# Patient Record
Sex: Female | Born: 1950 | Race: Black or African American | Hispanic: No | Smoking: Never smoker
Health system: Southern US, Community
[De-identification: ages and names within clinical notes are randomized; demographics above are authoritative.]

## PROBLEM LIST (undated history)

## (undated) DIAGNOSIS — R011 Cardiac murmur, unspecified: Secondary | ICD-10-CM

## (undated) DIAGNOSIS — E7800 Pure hypercholesterolemia, unspecified: Secondary | ICD-10-CM

---

## 1998-02-07 ENCOUNTER — Encounter: Payer: Self-pay | Admitting: Family Medicine

## 1998-02-07 ENCOUNTER — Ambulatory Visit (HOSPITAL_COMMUNITY): Admission: RE | Admit: 1998-02-07 | Discharge: 1998-02-07 | Payer: Self-pay | Admitting: Family Medicine

## 1998-03-23 ENCOUNTER — Ambulatory Visit (HOSPITAL_COMMUNITY): Admission: RE | Admit: 1998-03-23 | Discharge: 1998-03-23 | Payer: Self-pay | Admitting: Family Medicine

## 1998-03-23 ENCOUNTER — Encounter: Payer: Self-pay | Admitting: Family Medicine

## 2001-03-17 ENCOUNTER — Other Ambulatory Visit: Admission: RE | Admit: 2001-03-17 | Discharge: 2001-03-17 | Payer: Self-pay | Admitting: Family Medicine

## 2004-12-21 ENCOUNTER — Other Ambulatory Visit: Admission: RE | Admit: 2004-12-21 | Discharge: 2004-12-21 | Payer: Self-pay | Admitting: Family Medicine

## 2008-03-04 ENCOUNTER — Ambulatory Visit (HOSPITAL_COMMUNITY): Admission: RE | Admit: 2008-03-04 | Discharge: 2008-03-04 | Payer: Self-pay | Admitting: Family Medicine

## 2008-07-20 ENCOUNTER — Other Ambulatory Visit: Admission: RE | Admit: 2008-07-20 | Discharge: 2008-07-20 | Payer: Self-pay | Admitting: Family Medicine

## 2009-04-28 ENCOUNTER — Ambulatory Visit (HOSPITAL_COMMUNITY): Admission: RE | Admit: 2009-04-28 | Discharge: 2009-04-28 | Payer: Self-pay | Admitting: Family Medicine

## 2010-03-20 ENCOUNTER — Other Ambulatory Visit (HOSPITAL_BASED_OUTPATIENT_CLINIC_OR_DEPARTMENT_OTHER): Payer: Self-pay | Admitting: Family Medicine

## 2010-03-20 ENCOUNTER — Other Ambulatory Visit (HOSPITAL_COMMUNITY): Payer: Self-pay | Admitting: Family Medicine

## 2010-03-20 DIAGNOSIS — Z1231 Encounter for screening mammogram for malignant neoplasm of breast: Secondary | ICD-10-CM

## 2010-05-02 ENCOUNTER — Ambulatory Visit (HOSPITAL_COMMUNITY)
Admission: RE | Admit: 2010-05-02 | Discharge: 2010-05-02 | Disposition: A | Discharge status: Home / Self Care | Payer: Self-pay | Source: Ambulatory Visit | Attending: Family Medicine | Admitting: Family Medicine

## 2010-05-02 DIAGNOSIS — Z1231 Encounter for screening mammogram for malignant neoplasm of breast: Secondary | ICD-10-CM

## 2010-12-05 ENCOUNTER — Encounter: Payer: Self-pay | Admitting: Emergency Medicine

## 2010-12-05 ENCOUNTER — Emergency Department (HOSPITAL_COMMUNITY)
Admission: EM | Admit: 2010-12-05 | Discharge: 2010-12-05 | Disposition: A | Discharge status: Home / Self Care | Payer: No Typology Code available for payment source | Attending: Emergency Medicine | Admitting: Emergency Medicine

## 2010-12-05 DIAGNOSIS — M542 Cervicalgia: Secondary | ICD-10-CM | POA: Insufficient documentation

## 2010-12-05 DIAGNOSIS — M545 Low back pain, unspecified: Secondary | ICD-10-CM | POA: Insufficient documentation

## 2010-12-05 DIAGNOSIS — M25519 Pain in unspecified shoulder: Secondary | ICD-10-CM | POA: Insufficient documentation

## 2010-12-05 HISTORY — DX: Cardiac murmur, unspecified: R01.1

## 2010-12-05 MED ORDER — IBUPROFEN 600 MG PO TABS: 600.0000 mg | ORAL_TABLET | Freq: Four times a day (QID) | ORAL | Status: AC | PRN

## 2010-12-05 MED ORDER — CYCLOBENZAPRINE HCL 10 MG PO TABS: 5.0000 mg | ORAL_TABLET | Freq: Three times a day (TID) | ORAL | Status: AC | PRN

## 2010-12-05 MED ORDER — IBUPROFEN 800 MG PO TABS
800.0000 mg | ORAL_TABLET | Freq: Once | ORAL | Status: AC
  Administered 2010-12-05: 800 mg via ORAL
  Filled 2010-12-05: qty 1
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
## 2010-12-06 NOTE — ED Provider Notes (Signed)
Medical screening examination/treatment/procedure(s) were performed by non-physician practitioner and as supervising physician I was immediately available for consultation/collaboration. Devoria Albe, MD, Armando Gang   Ward Givens, MD 12/06/10 [phone]

## 2012-11-11 ENCOUNTER — Other Ambulatory Visit (HOSPITAL_COMMUNITY): Payer: Self-pay | Admitting: Family Medicine

## 2012-11-11 DIAGNOSIS — Z1231 Encounter for screening mammogram for malignant neoplasm of breast: Secondary | ICD-10-CM

## 2012-12-08 ENCOUNTER — Ambulatory Visit (HOSPITAL_COMMUNITY)
Admission: RE | Admit: 2012-12-08 | Discharge: 2012-12-08 | Disposition: A | Discharge status: Home / Self Care | Payer: No Typology Code available for payment source | Source: Ambulatory Visit | Attending: Family Medicine | Admitting: Family Medicine

## 2012-12-08 DIAGNOSIS — Z1231 Encounter for screening mammogram for malignant neoplasm of breast: Secondary | ICD-10-CM | POA: Insufficient documentation

## 2014-03-29 ENCOUNTER — Other Ambulatory Visit (HOSPITAL_COMMUNITY): Payer: Self-pay | Admitting: Family Medicine

## 2014-03-29 DIAGNOSIS — Z1231 Encounter for screening mammogram for malignant neoplasm of breast: Secondary | ICD-10-CM

## 2014-04-04 ENCOUNTER — Ambulatory Visit (HOSPITAL_COMMUNITY): Payer: No Typology Code available for payment source

## 2014-04-21 ENCOUNTER — Ambulatory Visit (HOSPITAL_COMMUNITY)
Admission: RE | Admit: 2014-04-21 | Discharge: 2014-04-21 | Disposition: A | Discharge status: Home / Self Care | Payer: 59 | Source: Ambulatory Visit | Attending: Family Medicine | Admitting: Family Medicine

## 2014-04-21 DIAGNOSIS — Z1231 Encounter for screening mammogram for malignant neoplasm of breast: Secondary | ICD-10-CM | POA: Diagnosis present

## 2015-04-06 ENCOUNTER — Other Ambulatory Visit (HOSPITAL_COMMUNITY)
Admission: RE | Admit: 2015-04-06 | Discharge: 2015-04-06 | Disposition: A | Discharge status: Home / Self Care | Payer: BLUE CROSS/BLUE SHIELD | Source: Ambulatory Visit | Attending: Family Medicine | Admitting: Family Medicine

## 2015-04-06 DIAGNOSIS — Z113 Encounter for screening for infections with a predominantly sexual mode of transmission: Secondary | ICD-10-CM | POA: Diagnosis present

## 2015-04-06 DIAGNOSIS — Z1151 Encounter for screening for human papillomavirus (HPV): Secondary | ICD-10-CM | POA: Diagnosis not present

## 2015-04-06 DIAGNOSIS — N760 Acute vaginitis: Secondary | ICD-10-CM | POA: Diagnosis present

## 2015-04-06 DIAGNOSIS — Z01419 Encounter for gynecological examination (general) (routine) without abnormal findings: Secondary | ICD-10-CM | POA: Diagnosis present

## 2015-07-05 ENCOUNTER — Other Ambulatory Visit: Payer: Self-pay | Admitting: Family Medicine

## 2015-07-05 DIAGNOSIS — Z1231 Encounter for screening mammogram for malignant neoplasm of breast: Secondary | ICD-10-CM

## 2015-08-07 ENCOUNTER — Ambulatory Visit: Payer: No Typology Code available for payment source

## 2015-08-15 ENCOUNTER — Ambulatory Visit
Admission: RE | Admit: 2015-08-15 | Discharge: 2015-08-15 | Disposition: A | Discharge status: Home / Self Care | Payer: BLUE CROSS/BLUE SHIELD | Source: Ambulatory Visit | Attending: Family Medicine | Admitting: Family Medicine

## 2015-08-15 DIAGNOSIS — Z1231 Encounter for screening mammogram for malignant neoplasm of breast: Secondary | ICD-10-CM

## 2017-09-10 DIAGNOSIS — Z0100 Encounter for examination of eyes and vision without abnormal findings: Secondary | ICD-10-CM | POA: Diagnosis not present

## 2017-09-15 ENCOUNTER — Other Ambulatory Visit: Payer: Self-pay | Admitting: Family Medicine

## 2017-09-15 DIAGNOSIS — Z1231 Encounter for screening mammogram for malignant neoplasm of breast: Secondary | ICD-10-CM

## 2017-09-23 ENCOUNTER — Other Ambulatory Visit (HOSPITAL_COMMUNITY)
Admission: RE | Admit: 2017-09-23 | Discharge: 2017-09-23 | Disposition: A | Discharge status: Home / Self Care | Payer: Medicare Other | Source: Ambulatory Visit | Attending: Family Medicine | Admitting: Family Medicine

## 2017-09-23 ENCOUNTER — Other Ambulatory Visit: Payer: Self-pay | Admitting: Family Medicine

## 2017-09-23 DIAGNOSIS — R7309 Other abnormal glucose: Secondary | ICD-10-CM | POA: Diagnosis not present

## 2017-09-23 DIAGNOSIS — Z01411 Encounter for gynecological examination (general) (routine) with abnormal findings: Secondary | ICD-10-CM | POA: Diagnosis present

## 2017-09-23 DIAGNOSIS — I10 Essential (primary) hypertension: Secondary | ICD-10-CM | POA: Diagnosis not present

## 2017-09-23 DIAGNOSIS — E782 Mixed hyperlipidemia: Secondary | ICD-10-CM | POA: Diagnosis not present

## 2017-09-25 LAB — CYTOLOGY - PAP
Diagnosis: NEGATIVE
HPV: NOT DETECTED

## 2017-10-13 ENCOUNTER — Ambulatory Visit
Admission: RE | Admit: 2017-10-13 | Discharge: 2017-10-13 | Disposition: A | Discharge status: Home / Self Care | Payer: Medicare Other | Source: Ambulatory Visit | Attending: Family Medicine | Admitting: Family Medicine

## 2017-10-13 DIAGNOSIS — Z1231 Encounter for screening mammogram for malignant neoplasm of breast: Secondary | ICD-10-CM | POA: Diagnosis not present

## 2018-01-18 DIAGNOSIS — J209 Acute bronchitis, unspecified: Secondary | ICD-10-CM | POA: Diagnosis not present

## 2018-02-09 DIAGNOSIS — L218 Other seborrheic dermatitis: Secondary | ICD-10-CM | POA: Diagnosis not present

## 2018-02-09 DIAGNOSIS — L258 Unspecified contact dermatitis due to other agents: Secondary | ICD-10-CM | POA: Diagnosis not present

## 2018-04-02 ENCOUNTER — Other Ambulatory Visit: Payer: Self-pay | Admitting: Family Medicine

## 2018-04-02 ENCOUNTER — Other Ambulatory Visit (HOSPITAL_COMMUNITY)
Admission: RE | Admit: 2018-04-02 | Discharge: 2018-04-02 | Disposition: A | Discharge status: Home / Self Care | Payer: Medicare Other | Source: Ambulatory Visit | Attending: Family Medicine | Admitting: Family Medicine

## 2018-04-02 DIAGNOSIS — E559 Vitamin D deficiency, unspecified: Secondary | ICD-10-CM | POA: Diagnosis not present

## 2018-04-02 DIAGNOSIS — E785 Hyperlipidemia, unspecified: Secondary | ICD-10-CM | POA: Diagnosis not present

## 2018-04-02 DIAGNOSIS — R7303 Prediabetes: Secondary | ICD-10-CM | POA: Diagnosis not present

## 2018-04-02 DIAGNOSIS — Z0000 Encounter for general adult medical examination without abnormal findings: Secondary | ICD-10-CM | POA: Diagnosis not present

## 2018-04-03 ENCOUNTER — Other Ambulatory Visit: Payer: Self-pay | Admitting: Family Medicine

## 2018-04-03 DIAGNOSIS — E2839 Other primary ovarian failure: Secondary | ICD-10-CM

## 2018-04-06 LAB — CYTOLOGY - PAP: Diagnosis: NEGATIVE

## 2018-06-18 ENCOUNTER — Other Ambulatory Visit: Payer: Self-pay

## 2018-06-18 ENCOUNTER — Ambulatory Visit
Admission: RE | Admit: 2018-06-18 | Discharge: 2018-06-18 | Disposition: A | Discharge status: Home / Self Care | Payer: Medicare Other | Source: Ambulatory Visit | Attending: Family Medicine | Admitting: Family Medicine

## 2018-06-18 DIAGNOSIS — Z780 Asymptomatic menopausal state: Secondary | ICD-10-CM | POA: Diagnosis not present

## 2018-06-18 DIAGNOSIS — E2839 Other primary ovarian failure: Secondary | ICD-10-CM

## 2018-06-18 DIAGNOSIS — M8589 Other specified disorders of bone density and structure, multiple sites: Secondary | ICD-10-CM | POA: Diagnosis not present

## 2018-06-29 DIAGNOSIS — R7303 Prediabetes: Secondary | ICD-10-CM | POA: Diagnosis not present

## 2018-06-29 DIAGNOSIS — E785 Hyperlipidemia, unspecified: Secondary | ICD-10-CM | POA: Diagnosis not present

## 2019-02-25 ENCOUNTER — Ambulatory Visit: Payer: Medicare Other

## 2019-03-01 ENCOUNTER — Ambulatory Visit: Payer: Medicare Other | Attending: Family

## 2019-03-01 DIAGNOSIS — Z23 Encounter for immunization: Secondary | ICD-10-CM | POA: Insufficient documentation

## 2019-03-01 NOTE — Progress Notes (Signed)
   Covid-19 Vaccination Clinic  Name:  Carol Blake    MRN: [id number] DOB: [birth year]  03/01/2019  [name] was observed post Covid-19 immunization for 15 minutes without incidence. She was provided with Vaccine Information Sheet and instruction to access the V-Safe system.   [name] was instructed to call 911 with any severe reactions post vaccine: Marland Kitchen Difficulty breathing  . Swelling of your face and throat  . A fast heartbeat  . A bad rash all over your body  . Dizziness and weakness    Immunizations Administered    Name Date Dose VIS Date Route   Moderna COVID-19 Vaccine 03/01/2019  9:25 AM 0.5 mL 12/08/2018 Intramuscular   Manufacturer: Moderna   Lot: [id number]   NDC: 51582-658-71

## 2019-03-30 ENCOUNTER — Ambulatory Visit: Payer: Medicare Other | Attending: Family

## 2019-03-30 DIAGNOSIS — Z23 Encounter for immunization: Secondary | ICD-10-CM

## 2019-03-30 NOTE — Progress Notes (Signed)
   Covid-19 Vaccination Clinic  Name:  KHARI LETT    MRN: [id number] DOB: August 20, 1950  03/30/2019  Ms. Cinquemani was observed post Covid-19 immunization for 15 minutes without incident. She was provided with Vaccine Information Sheet and instruction to access the V-Safe system.   Ms. Briney was instructed to call 911 with any severe reactions post vaccine: Marland Kitchen Difficulty breathing  . Swelling of face and throat  . A fast heartbeat  . A bad rash all over body  . Dizziness and weakness   Immunizations Administered    Name Date Dose VIS Date Route   Moderna COVID-19 Vaccine 03/30/2019  9:22 AM 0.5 mL 12/08/2018 Intramuscular   Manufacturer: Moderna   Lot: [id number]   NDC: 09628-366-29

## 2019-05-11 IMAGING — MG DIGITAL SCREENING BILATERAL MAMMOGRAM WITH TOMO AND CAD
8 series · 8 of 24 positions shown · non-contrast
Comparison: Previous exam(s).

CLINICAL DATA: Screening.

EXAM:
DIGITAL SCREENING BILATERAL MAMMOGRAM WITH TOMO AND CAD

[R MLO synth-2D]
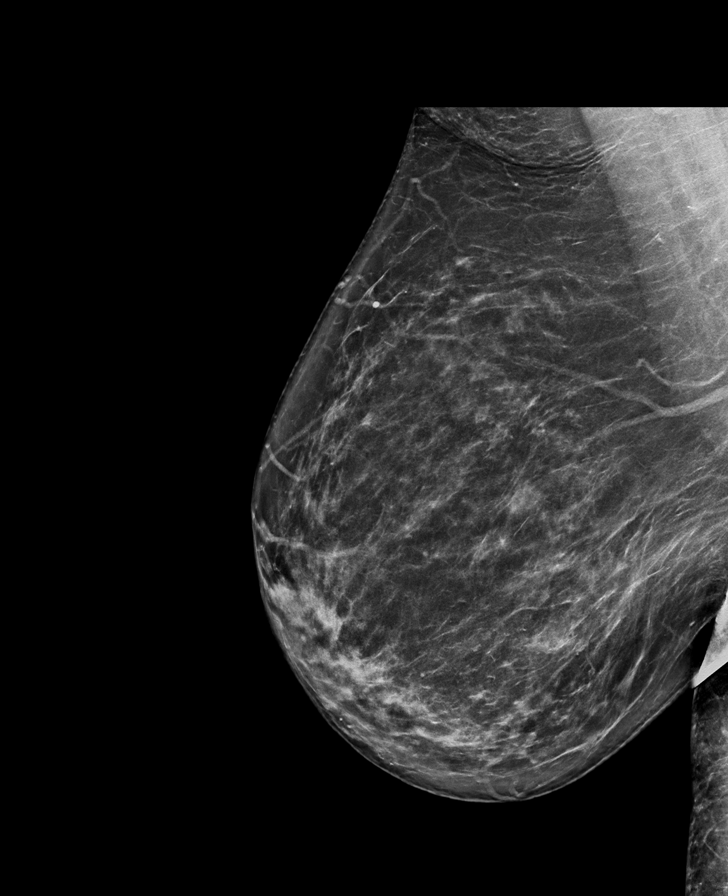

[L CC synth-2D]
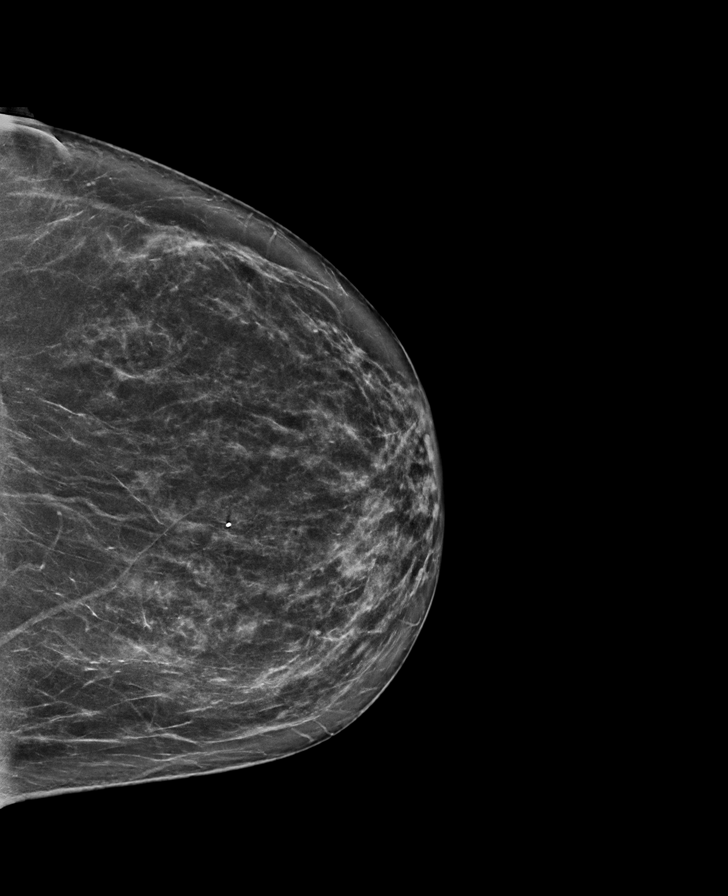

[L MLO synth-2D]
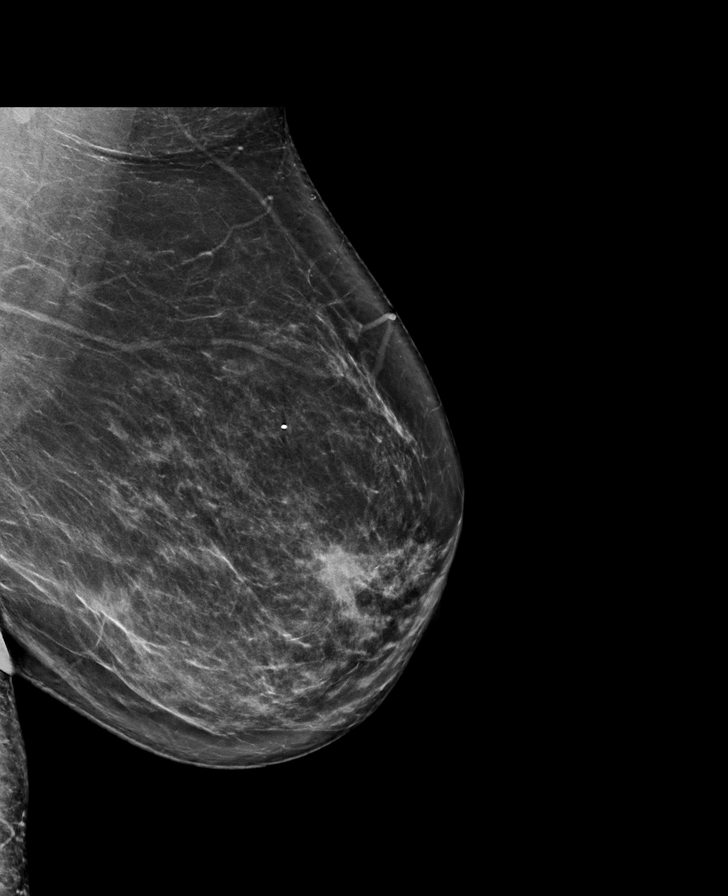

[R CC synth-2D]
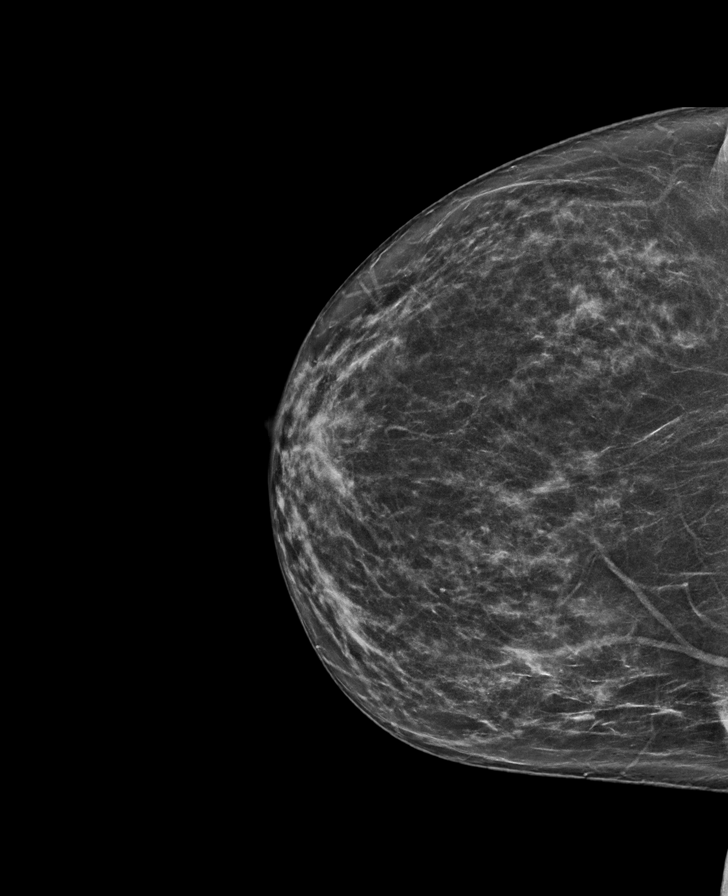

[R CC tomo · tomo slice 37/72.0]
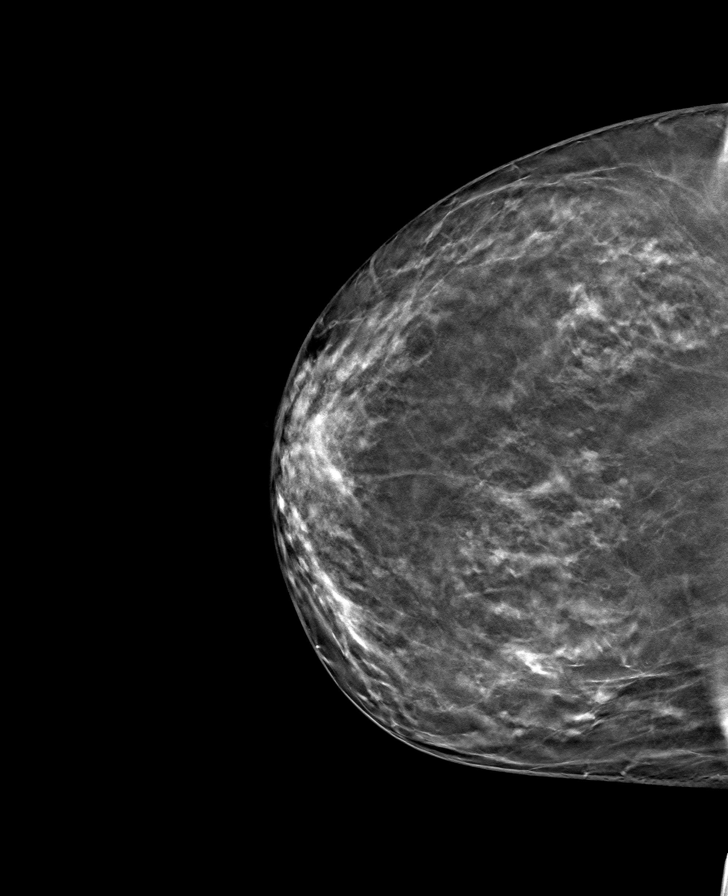

[L CC tomo · tomo slice 40/79.0]
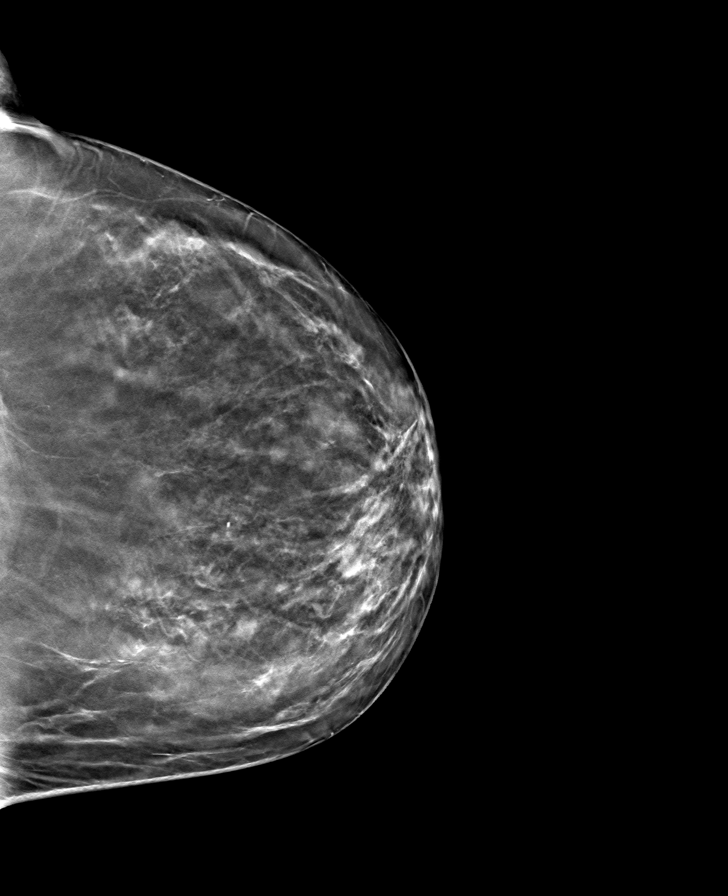

[L MLO tomo · tomo slice 43/86.0]
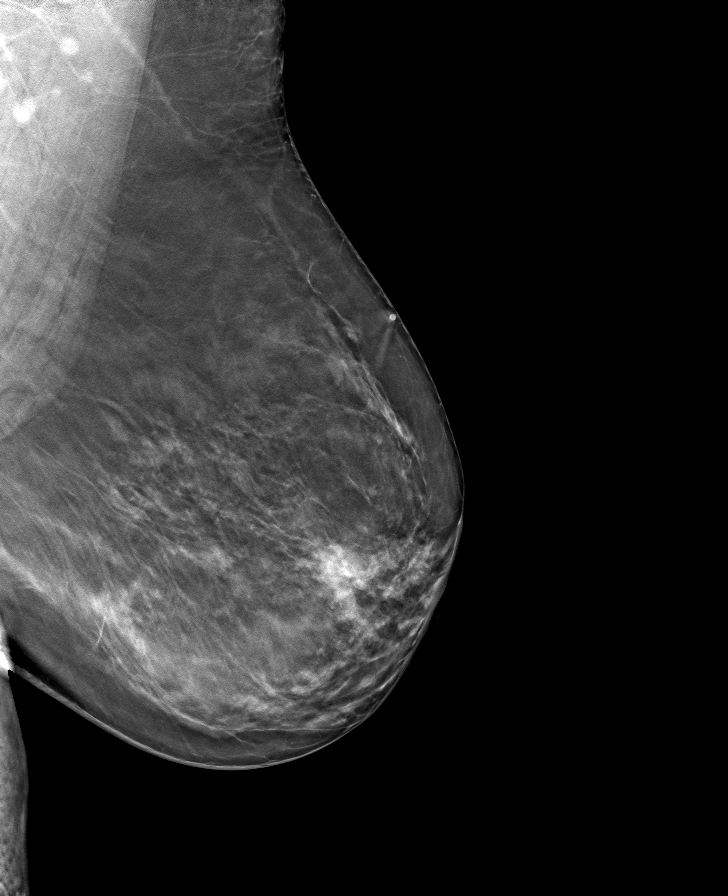

[R MLO tomo · tomo slice 44/87.0]
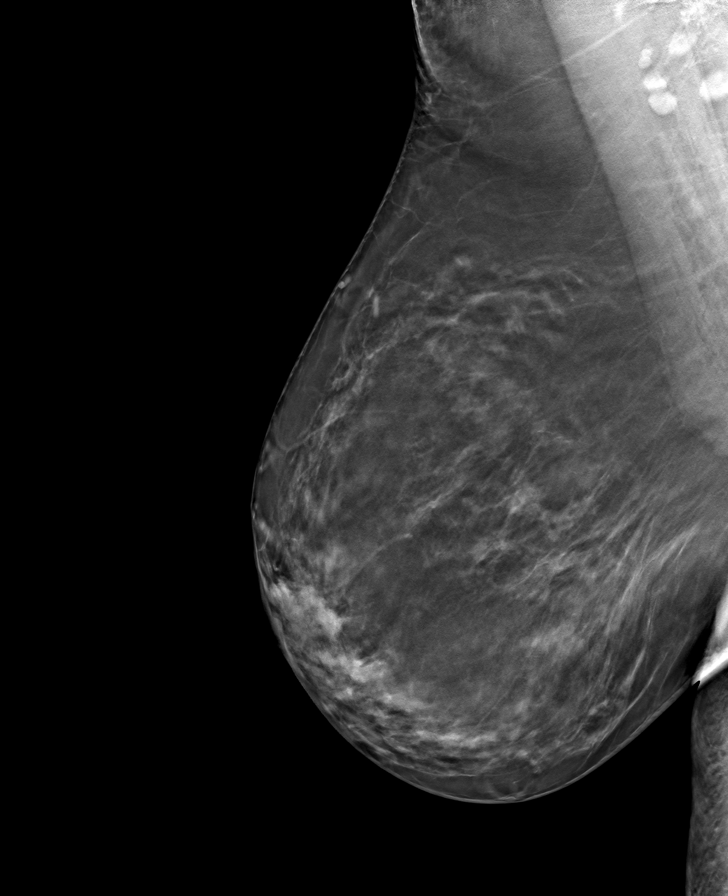

[8 of 24 positions shown; findings below may reference images not displayed]

ACR Breast Density Category b: There are scattered areas of
fibroglandular density.
FINDINGS: There are no findings suspicious for malignancy. Images were
processed with CAD.
IMPRESSION: No mammographic evidence of malignancy. A result letter of this
screening mammogram will be mailed directly to the patient.

RECOMMENDATION:
Screening mammogram in one year. (Code:[id number])

BI-RADS CATEGORY  1: Negative.

## 2019-06-19 ENCOUNTER — Other Ambulatory Visit: Payer: Self-pay

## 2019-06-19 ENCOUNTER — Encounter (HOSPITAL_BASED_OUTPATIENT_CLINIC_OR_DEPARTMENT_OTHER): Payer: Self-pay | Admitting: Emergency Medicine

## 2019-06-19 ENCOUNTER — Emergency Department (HOSPITAL_BASED_OUTPATIENT_CLINIC_OR_DEPARTMENT_OTHER)
Admission: EM | Admit: 2019-06-19 | Discharge: 2019-06-19 | Disposition: A | Discharge status: Home / Self Care | Payer: Medicare Other | Attending: Emergency Medicine | Admitting: Emergency Medicine

## 2019-06-19 DIAGNOSIS — R42 Dizziness and giddiness: Secondary | ICD-10-CM | POA: Diagnosis present

## 2019-06-19 DIAGNOSIS — H8112 Benign paroxysmal vertigo, left ear: Secondary | ICD-10-CM | POA: Diagnosis not present

## 2019-06-19 HISTORY — DX: Pure hypercholesterolemia, unspecified: E78.00

## 2019-06-19 MED ORDER — MECLIZINE HCL 25 MG PO TABS: 25.0000 mg | ORAL_TABLET | Freq: Three times a day (TID) | ORAL | 0 refills | Status: AC | PRN

## 2019-06-19 NOTE — Discharge Instructions (Signed)
Avoid going to the left as that seems to trigger your vertigo.  However you were given a prescription that you can use if the vertigo becomes worse.  However if it has only lasting minutes it should just get better on its own.  If you start to have persistent symptoms, difficulty walking, any difficulty with speech or confusion you should return to the emergency room immediately for MRI.

## 2019-06-19 NOTE — ED Provider Notes (Signed)
[facility] EMERGENCY DEPARTMENT Provider Note   CSN: [id number] Arrival date & time: 06/19/19  1915     History Chief Complaint  Patient presents with  . Dizziness    Carol Blake is a 69 y.o. female.  The history is provided by the patient.  Dizziness Quality:  Vertigo Severity:  Severe Onset quality:  Sudden Duration: only lasted 1-28min. Timing:  Intermittent Progression:  Resolved Chronicity:  New Context comment:  Started both times after she got in her bed and rolled to her left Relieved by:  Being still Worsened by:  Nothing Ineffective treatments:  None tried Associated symptoms: vomiting   Associated symptoms: no chest pain, no diarrhea, no headaches, no hearing loss, no nausea, no palpitations, no shortness of breath, no syncope, no tinnitus, no vision changes and no weakness   Associated symptoms comment:  During one of the episodes today she had one episode of vomiting which then resolved when the dizziness resolved.  She has had no difficulty walking.  No congestion that is new, fever, headache or neck pain.  Patient reports she has had tingling in her left arm and leg for quite some time and she is intermittently had that tingling but did have an episode of the tingling with the dizziness today that lasted for 1 to 2 minutes.  Is not having any of that currently. Risk factors comment:  History of hyperlipidemia      Past Medical History:  Diagnosis Date  . Hypercholesterolemia   . Murmur, heart     There are no problems to display for this patient.   History reviewed. No pertinent surgical history.   OB History   No obstetric history on file.     No family history on file.  Social History   Tobacco Use  . Smoking status: Never Smoker  . Smokeless tobacco: Never Used  Substance Use Topics  . Alcohol use: No  . Drug use: No    Home Medications Prior to Admission medications   Medication Sig Start Date End Date Taking?  Authorizing Provider  atorvastatin (LIPITOR) 20 MG tablet Take 20 mg by mouth daily.   Yes [provider]  meclizine (ANTIVERT) 25 MG tablet Take 1 tablet (25 mg total) by mouth 3 (three) times daily as needed for dizziness. 06/19/19   Gwyneth Sprout, MD    Allergies    Nickel  Review of Systems   Review of Systems  HENT: Negative for hearing loss and tinnitus.   Respiratory: Negative for shortness of breath.   Cardiovascular: Negative for chest pain, palpitations and syncope.  Gastrointestinal: Positive for vomiting. Negative for diarrhea and nausea.  Neurological: Positive for dizziness. Negative for weakness and headaches.  All other systems reviewed and are negative.   Physical Exam Updated Vital Signs BP 132/89 (BP Location: Right Arm)   Pulse (!) 59   Temp 98.6 F (37 C) (Oral)   Resp 18   Ht 5\' 5"  (1.651 m)   Wt 81.2 kg   SpO2 100%   BMI 29.79 kg/m   Physical Exam Vitals and nursing note reviewed.  Constitutional:      General: She is not in acute distress.    Appearance: Normal appearance. She is well-developed and normal weight.  HENT:     Head: Normocephalic and atraumatic.     Nose: Nose normal.  Eyes:     Pupils: Pupils are equal, round, and reactive to light.  Cardiovascular:     Rate and  Rhythm: Normal rate and regular rhythm.     Pulses: Normal pulses.     Heart sounds: Normal heart sounds. No murmur heard.  No friction rub.  Pulmonary:     Effort: Pulmonary effort is normal.     Breath sounds: Normal breath sounds. No wheezing or rales.  Abdominal:     General: Bowel sounds are normal. There is no distension.     Palpations: Abdomen is soft.     Tenderness: There is no abdominal tenderness. There is no guarding or rebound.  Musculoskeletal:        General: No tenderness. Normal range of motion.     Cervical back: Normal range of motion.     Right lower leg: No edema.     Left lower leg: No edema.     Comments: No edema  Skin:     General: Skin is warm and dry.     Capillary Refill: Capillary refill takes less than 2 seconds.     Findings: No rash.  Neurological:     General: No focal deficit present.     Mental Status: She is alert and oriented to person, place, and time. Mental status is at baseline.     Cranial Nerves: No cranial nerve deficit, dysarthria or facial asymmetry.     Sensory: Sensation is intact.     Motor: Motor function is intact. No weakness or pronator drift.     Coordination: Coordination is intact. Heel to [facility] Test normal.     Gait: Gait is intact. Gait normal.     Comments: No notable nystagmus  Psychiatric:        Mood and Affect: Mood normal.        Behavior: Behavior normal.        Thought Content: Thought content normal.     ED Results / Procedures / Treatments   Labs (all labs ordered are listed, but only abnormal results are displayed) Labs Reviewed - No data to display  EKG None  Radiology No results found.  Procedures Procedures (including critical care time)  Medications Ordered in ED Medications - No data to display  ED Course  I have reviewed the triage vital signs and the nursing notes.  Pertinent labs & imaging results that were available during my care of the patient were reviewed by me and considered in my medical decision making (see chart for details).    MDM Rules/Calculators/A&P                          Pt with sx most consistent with peripheral vertigo.  No systemic or infectious sx.  Normal neuro exam without weakness, ataxia or cerebellar findings on exam.  Normal vision.  Sx are currently gone but has had 3 episodes in the last 24 hours and precipitated by moving the head to the left.  No hx of Stroke and low likelihood.  Only risk factor is hyperlipidemia and normal VS. Will treat for peripheral vertigo and pt given return precautions.   Final Clinical Impression(s) / ED Diagnoses Final diagnoses:  Benign positional vertigo, left    Rx / DC  Orders ED Discharge Orders         Ordered    meclizine (ANTIVERT) 25 MG tablet  3 times daily PRN     Discontinue  Reprint     06/19/19 2029           Blanchie Dessert, MD 06/19/19 2120

## 2019-06-19 NOTE — ED Triage Notes (Signed)
Reports 2-3 episodes of dizziness ("room spinning") yesterday afternoon while laying down & turning over. States she turned and held the headboard and it resolved within a couple minutes. Pt states it seemed to happen with movement but was okay when she stayed still. Last episode ~1 hr ago. Denies any confusion, but states she did "have a little tingling in L arm". Pt states she has had issues with "tingling" prior to this, and has been worked up for that in March. Pt states it seems to happen when she turns to her left.

## 2020-04-04 ENCOUNTER — Other Ambulatory Visit: Payer: Self-pay | Admitting: Family Medicine

## 2020-04-04 DIAGNOSIS — M8588 Other specified disorders of bone density and structure, other site: Secondary | ICD-10-CM | POA: Diagnosis not present

## 2020-04-04 DIAGNOSIS — R7303 Prediabetes: Secondary | ICD-10-CM | POA: Diagnosis not present

## 2020-04-04 DIAGNOSIS — Z0000 Encounter for general adult medical examination without abnormal findings: Secondary | ICD-10-CM | POA: Diagnosis not present

## 2020-04-04 DIAGNOSIS — Z1231 Encounter for screening mammogram for malignant neoplasm of breast: Secondary | ICD-10-CM

## 2020-04-04 DIAGNOSIS — E559 Vitamin D deficiency, unspecified: Secondary | ICD-10-CM | POA: Diagnosis not present

## 2020-04-04 DIAGNOSIS — Z79899 Other long term (current) drug therapy: Secondary | ICD-10-CM | POA: Diagnosis not present

## 2020-04-04 DIAGNOSIS — E785 Hyperlipidemia, unspecified: Secondary | ICD-10-CM | POA: Diagnosis not present

## 2020-04-04 DIAGNOSIS — Z1159 Encounter for screening for other viral diseases: Secondary | ICD-10-CM | POA: Diagnosis not present

## 2020-04-04 DIAGNOSIS — Z114 Encounter for screening for human immunodeficiency virus [HIV]: Secondary | ICD-10-CM | POA: Diagnosis not present

## 2020-04-04 DIAGNOSIS — M8580 Other specified disorders of bone density and structure, unspecified site: Secondary | ICD-10-CM

## 2020-04-04 DIAGNOSIS — Z113 Encounter for screening for infections with a predominantly sexual mode of transmission: Secondary | ICD-10-CM | POA: Diagnosis not present

## 2020-04-05 DIAGNOSIS — H40013 Open angle with borderline findings, low risk, bilateral: Secondary | ICD-10-CM | POA: Diagnosis not present

## 2020-05-18 DIAGNOSIS — Z79899 Other long term (current) drug therapy: Secondary | ICD-10-CM | POA: Diagnosis not present

## 2020-05-18 DIAGNOSIS — E785 Hyperlipidemia, unspecified: Secondary | ICD-10-CM | POA: Diagnosis not present

## 2020-06-14 ENCOUNTER — Other Ambulatory Visit: Payer: Self-pay

## 2020-06-14 ENCOUNTER — Ambulatory Visit
Admission: RE | Admit: 2020-06-14 | Discharge: 2020-06-14 | Disposition: A | Discharge status: Home / Self Care | Payer: Medicare Other | Source: Ambulatory Visit | Attending: Family Medicine | Admitting: Family Medicine

## 2020-06-14 DIAGNOSIS — Z1231 Encounter for screening mammogram for malignant neoplasm of breast: Secondary | ICD-10-CM

## 2020-08-15 DIAGNOSIS — Z20822 Contact with and (suspected) exposure to covid-19: Secondary | ICD-10-CM | POA: Diagnosis not present

## 2020-08-17 DIAGNOSIS — U071 COVID-19: Secondary | ICD-10-CM | POA: Diagnosis not present

## 2020-10-04 ENCOUNTER — Other Ambulatory Visit: Payer: Self-pay

## 2020-10-04 ENCOUNTER — Ambulatory Visit
Admission: RE | Admit: 2020-10-04 | Discharge: 2020-10-04 | Disposition: A | Discharge status: Home / Self Care | Payer: Medicare HMO | Source: Ambulatory Visit | Attending: Family Medicine | Admitting: Family Medicine

## 2020-10-04 DIAGNOSIS — Z780 Asymptomatic menopausal state: Secondary | ICD-10-CM | POA: Diagnosis not present

## 2020-10-04 DIAGNOSIS — M8589 Other specified disorders of bone density and structure, multiple sites: Secondary | ICD-10-CM | POA: Diagnosis not present

## 2020-10-04 DIAGNOSIS — M8580 Other specified disorders of bone density and structure, unspecified site: Secondary | ICD-10-CM

## 2020-11-20 DIAGNOSIS — Z0100 Encounter for examination of eyes and vision without abnormal findings: Secondary | ICD-10-CM | POA: Diagnosis not present

## 2020-12-05 DIAGNOSIS — Z0100 Encounter for examination of eyes and vision without abnormal findings: Secondary | ICD-10-CM | POA: Diagnosis not present

## 2021-03-28 DIAGNOSIS — H2513 Age-related nuclear cataract, bilateral: Secondary | ICD-10-CM | POA: Diagnosis not present

## 2021-03-28 DIAGNOSIS — H16223 Keratoconjunctivitis sicca, not specified as Sjogren's, bilateral: Secondary | ICD-10-CM | POA: Diagnosis not present

## 2021-03-28 DIAGNOSIS — H40023 Open angle with borderline findings, high risk, bilateral: Secondary | ICD-10-CM | POA: Diagnosis not present

## 2021-04-25 DIAGNOSIS — H16223 Keratoconjunctivitis sicca, not specified as Sjogren's, bilateral: Secondary | ICD-10-CM | POA: Diagnosis not present

## 2021-05-10 DIAGNOSIS — Z0000 Encounter for general adult medical examination without abnormal findings: Secondary | ICD-10-CM | POA: Diagnosis not present

## 2021-05-10 DIAGNOSIS — K641 Second degree hemorrhoids: Secondary | ICD-10-CM | POA: Diagnosis not present

## 2021-05-10 DIAGNOSIS — N952 Postmenopausal atrophic vaginitis: Secondary | ICD-10-CM | POA: Diagnosis not present

## 2021-05-10 DIAGNOSIS — R7303 Prediabetes: Secondary | ICD-10-CM | POA: Diagnosis not present

## 2021-05-10 DIAGNOSIS — E785 Hyperlipidemia, unspecified: Secondary | ICD-10-CM | POA: Diagnosis not present

## 2021-05-10 DIAGNOSIS — E559 Vitamin D deficiency, unspecified: Secondary | ICD-10-CM | POA: Diagnosis not present

## 2021-05-17 ENCOUNTER — Other Ambulatory Visit: Payer: Self-pay | Admitting: Family Medicine

## 2021-05-17 DIAGNOSIS — Z1231 Encounter for screening mammogram for malignant neoplasm of breast: Secondary | ICD-10-CM

## 2021-05-30 DIAGNOSIS — H40003 Preglaucoma, unspecified, bilateral: Secondary | ICD-10-CM | POA: Diagnosis not present

## 2021-05-30 DIAGNOSIS — H2513 Age-related nuclear cataract, bilateral: Secondary | ICD-10-CM | POA: Diagnosis not present

## 2021-06-06 DIAGNOSIS — K642 Third degree hemorrhoids: Secondary | ICD-10-CM | POA: Diagnosis not present

## 2021-06-13 ENCOUNTER — Ambulatory Visit
Admission: RE | Admit: 2021-06-13 | Discharge: 2021-06-13 | Disposition: A | Discharge status: Home / Self Care | Payer: Medicare HMO | Source: Ambulatory Visit | Attending: Family Medicine | Admitting: Family Medicine

## 2021-06-13 DIAGNOSIS — Z1231 Encounter for screening mammogram for malignant neoplasm of breast: Secondary | ICD-10-CM

## 2021-06-15 ENCOUNTER — Ambulatory Visit
Admission: RE | Admit: 2021-06-15 | Discharge: 2021-06-15 | Disposition: A | Discharge status: Home / Self Care | Payer: Medicare HMO | Source: Ambulatory Visit | Attending: Family Medicine | Admitting: Family Medicine

## 2021-06-15 DIAGNOSIS — Z1231 Encounter for screening mammogram for malignant neoplasm of breast: Secondary | ICD-10-CM | POA: Diagnosis not present

## 2021-06-28 DIAGNOSIS — E785 Hyperlipidemia, unspecified: Secondary | ICD-10-CM | POA: Diagnosis not present

## 2021-06-28 DIAGNOSIS — Z79899 Other long term (current) drug therapy: Secondary | ICD-10-CM | POA: Diagnosis not present

## 2021-08-14 DIAGNOSIS — K642 Third degree hemorrhoids: Secondary | ICD-10-CM | POA: Diagnosis not present

## 2021-08-16 DIAGNOSIS — Z79899 Other long term (current) drug therapy: Secondary | ICD-10-CM | POA: Diagnosis not present

## 2021-08-16 DIAGNOSIS — E785 Hyperlipidemia, unspecified: Secondary | ICD-10-CM | POA: Diagnosis not present

## 2021-09-06 DIAGNOSIS — R4789 Other speech disturbances: Secondary | ICD-10-CM | POA: Diagnosis not present

## 2021-09-06 DIAGNOSIS — F439 Reaction to severe stress, unspecified: Secondary | ICD-10-CM | POA: Diagnosis not present

## 2021-09-06 DIAGNOSIS — H8112 Benign paroxysmal vertigo, left ear: Secondary | ICD-10-CM | POA: Diagnosis not present

## 2021-09-13 ENCOUNTER — Encounter: Payer: Self-pay | Admitting: Rehabilitative and Restorative Service Providers"

## 2021-09-13 ENCOUNTER — Ambulatory Visit: Payer: Medicare HMO | Attending: Family Medicine | Admitting: Rehabilitative and Restorative Service Providers"

## 2021-09-13 DIAGNOSIS — H8112 Benign paroxysmal vertigo, left ear: Secondary | ICD-10-CM | POA: Insufficient documentation

## 2021-09-13 NOTE — Therapy (Signed)
OUTPATIENT PHYSICAL THERAPY VESTIBULAR EVALUATION     Patient Name: Carol Blake MRN: [id number] DOB:06/17/1950, 71 y.o., female Today's Date: 09/13/2021  PCP: Harlan Stains, MD REFERRING PROVIDER: Harlan Stains, MD   PT End of Session - 09/13/21 1132     Visit Number 1    Number of Visits 4    PT Start Time 2836    PT Stop Time 6294    PT Time Calculation (min) 36 min             Past Medical History:  Diagnosis Date   Hypercholesterolemia    Murmur, heart    History reviewed. No pertinent surgical history. There are no problems to display for this patient.   ONSET DATE: 09/04/21  REFERRING DIAG: BPPV, unspecified ear  THERAPY DIAG:  BPPV (benign paroxysmal positional vertigo), left  Rationale for Evaluation and Treatment Rehabilitation  SUBJECTIVE:   SUBJECTIVE STATEMENT: The patient has h/o intermittent vertigo beginning 2021 x a few day.  It has reoccurred 2-3 times, returning upon waking on the left side and lasted a few seconds on August 29. She also reports some unsteadiness with walking this time. Pt accompanied by: self  PERTINENT HISTORY: episodic vertigo.     PAIN:  Are you having pain? Yes: Pain location: occasional sharp pain in the left eye Pain description: sharp at times  PRECAUTIONS: None  FALLS: Has patient fallen in last 6 months? No  LIVING ENVIRONMENT: Lives with: sister Lives in: House/apartment Stairs: Yes: External: 2 steps; none  PLOF: Independent  PATIENT GOALS Get rid of the vertigo, improve balance  OBJECTIVE:   POSTURE: No Significant postural limitations PATIENT SURVEYS:  FOTO 67%   VESTIBULAR ASSESSMENT   GENERAL OBSERVATION: The patient ambulates into the clinic independently.    SYMPTOM BEHAVIOR:   Subjective history: sudden onset of room spinning dizziness   Non-Vestibular symptoms:  n/a   Type of dizziness: Spinning/Vertigo   Frequency: intermittent    Duration: seconds   Aggravating factors:  Induced by motion: rolling to the left   Relieving factors: head stationary   Progression of symptoms: better   OCULOMOTOR EXAM:   Ocular Alignment: normal   Ocular ROM: No Limitations   Spontaneous Nystagmus: absent   Gaze-Induced Nystagmus: absent   Smooth Pursuits: intact   Saccades: intact      VESTIBULAR - OCULAR REFLEX:    Slow VOR: Normal/ able to maintain gaze x 10 reps without dizziness.   VOR Cancellation: Normal   Head-Impulse Test: HIT Right: negative HIT Left: negative      POSITIONAL TESTING: Right Dix-Hallpike: no nystagmus Left Dix-Hallpike: no nystagmus Right Roll Test: no nystagmus Left Roll Test: no nystagmus Bilateral sidelying test:  no nystagmus   BALANCE:  Foam standing with eyes open x 30 seconds           Foam standing with eyes closed x 30 seconds           Single limb stance 10 seconds R and L sides  VESTIBULAR TREATMENT:  Other: Provided HEP with link for videos for self epley maneuver at home and brandt daroff habituation due to L recurring BPPV that was resolved at today's session.  PATIENT EDUCATION: Education details: HEP Person educated: Patient Education method: Consulting civil engineer, Media planner, and Handouts Education comprehension: verbalized understanding and returned demonstration  HOME EXERCISE: Access Code: [email or web address] URL: [email or web address] Date: 09/13/2021 Prepared by: Rudell Cobb  Exercises - Self-Epley Maneuver Left Ear  - 2 x daily - 7 x weekly -  1 sets - 10 reps - Brandt-Daroff Vestibular Exercise  - 2 x daily - 7 x weekly - 1 sets - 5 reps   GOALS: Goals reviewed with patient? Yes   LONG TERM GOALS: Target date: 10/11/2021    The patient will verbalize understanding of self treatment of BPPV. Baseline:  Goal status: MET at eval   ASSESSMENT:  CLINICAL IMPRESSION: Patient is a 71 y.o. female who was seen today for physical therapy evaluation and treatment for recurring vertigo.  At today's session  BPPV was not elicited and she feels overall symptoms have resolved.  We discussed self mgmt of symptoms and patient to return to clinic if anything arises in next 4 weeks.   OBJECTIVE IMPAIRMENTS dizziness.   ACTIVITY LIMITATIONS  none at this time/ some muscle guarding in neck  PARTICIPATION LIMITATIONS:  none  PERSONAL FACTORS  n/a    REHAB POTENTIAL: Good  CLINICAL DECISION MAKING: Stable/uncomplicated  EVALUATION COMPLEXITY: Low   PLAN: PT FREQUENCY: 1x/week  PT DURATION: 4 weeks  PLANNED INTERVENTIONS: Neuromuscular re-education, Balance training, Gait training, Patient/Family education, and Self Care  PLAN FOR NEXT SESSION: No visits scheduled, will f/u as needed.   Rudell Cobb, PT 09/13/21 12:29 PM [facility] [address] [place], [place], [place] [address] Phone # [phone] Fax # [phone]

## 2021-10-17 DIAGNOSIS — Z79899 Other long term (current) drug therapy: Secondary | ICD-10-CM | POA: Diagnosis not present

## 2021-10-17 DIAGNOSIS — E785 Hyperlipidemia, unspecified: Secondary | ICD-10-CM | POA: Diagnosis not present

## 2021-12-02 DIAGNOSIS — H2513 Age-related nuclear cataract, bilateral: Secondary | ICD-10-CM | POA: Diagnosis not present

## 2021-12-05 DIAGNOSIS — E785 Hyperlipidemia, unspecified: Secondary | ICD-10-CM | POA: Diagnosis not present

## 2021-12-18 DIAGNOSIS — Z0100 Encounter for examination of eyes and vision without abnormal findings: Secondary | ICD-10-CM | POA: Diagnosis not present

## 2022-01-14 DIAGNOSIS — Z0100 Encounter for examination of eyes and vision without abnormal findings: Secondary | ICD-10-CM | POA: Diagnosis not present

## 2022-01-23 DIAGNOSIS — R7401 Elevation of levels of liver transaminase levels: Secondary | ICD-10-CM | POA: Diagnosis not present

## 2022-01-23 DIAGNOSIS — E785 Hyperlipidemia, unspecified: Secondary | ICD-10-CM | POA: Diagnosis not present

## 2022-02-14 DIAGNOSIS — Z808 Family history of malignant neoplasm of other organs or systems: Secondary | ICD-10-CM | POA: Diagnosis not present

## 2022-02-14 DIAGNOSIS — E785 Hyperlipidemia, unspecified: Secondary | ICD-10-CM | POA: Diagnosis not present

## 2022-02-14 DIAGNOSIS — R7303 Prediabetes: Secondary | ICD-10-CM | POA: Diagnosis not present

## 2022-03-14 DIAGNOSIS — Z713 Dietary counseling and surveillance: Secondary | ICD-10-CM | POA: Diagnosis not present

## 2022-03-14 DIAGNOSIS — R7303 Prediabetes: Secondary | ICD-10-CM | POA: Diagnosis not present

## 2022-03-14 DIAGNOSIS — E785 Hyperlipidemia, unspecified: Secondary | ICD-10-CM | POA: Diagnosis not present

## 2022-04-23 DIAGNOSIS — R7303 Prediabetes: Secondary | ICD-10-CM | POA: Diagnosis not present

## 2022-04-23 DIAGNOSIS — Z713 Dietary counseling and surveillance: Secondary | ICD-10-CM | POA: Diagnosis not present

## 2022-04-23 DIAGNOSIS — E785 Hyperlipidemia, unspecified: Secondary | ICD-10-CM | POA: Diagnosis not present

## 2022-05-29 DIAGNOSIS — Z800 Family history of malignant neoplasm of digestive organs: Secondary | ICD-10-CM | POA: Diagnosis not present

## 2022-05-29 DIAGNOSIS — N952 Postmenopausal atrophic vaginitis: Secondary | ICD-10-CM | POA: Diagnosis not present

## 2022-05-29 DIAGNOSIS — R7303 Prediabetes: Secondary | ICD-10-CM | POA: Diagnosis not present

## 2022-05-29 DIAGNOSIS — R490 Dysphonia: Secondary | ICD-10-CM | POA: Diagnosis not present

## 2022-05-29 DIAGNOSIS — Z0000 Encounter for general adult medical examination without abnormal findings: Secondary | ICD-10-CM | POA: Diagnosis not present

## 2022-05-29 DIAGNOSIS — E559 Vitamin D deficiency, unspecified: Secondary | ICD-10-CM | POA: Diagnosis not present

## 2022-05-29 DIAGNOSIS — E785 Hyperlipidemia, unspecified: Secondary | ICD-10-CM | POA: Diagnosis not present

## 2022-05-29 DIAGNOSIS — E669 Obesity, unspecified: Secondary | ICD-10-CM | POA: Diagnosis not present

## 2022-06-05 DIAGNOSIS — H40013 Open angle with borderline findings, low risk, bilateral: Secondary | ICD-10-CM | POA: Diagnosis not present

## 2022-07-09 DIAGNOSIS — K5730 Diverticulosis of large intestine without perforation or abscess without bleeding: Secondary | ICD-10-CM | POA: Diagnosis not present

## 2022-07-09 DIAGNOSIS — D175 Benign lipomatous neoplasm of intra-abdominal organs: Secondary | ICD-10-CM | POA: Diagnosis not present

## 2022-07-09 DIAGNOSIS — D125 Benign neoplasm of sigmoid colon: Secondary | ICD-10-CM | POA: Diagnosis not present

## 2022-07-09 DIAGNOSIS — Z1211 Encounter for screening for malignant neoplasm of colon: Secondary | ICD-10-CM | POA: Diagnosis not present

## 2022-07-09 DIAGNOSIS — D124 Benign neoplasm of descending colon: Secondary | ICD-10-CM | POA: Diagnosis not present

## 2022-07-09 DIAGNOSIS — K648 Other hemorrhoids: Secondary | ICD-10-CM | POA: Diagnosis not present

## 2022-07-12 ENCOUNTER — Ambulatory Visit: Payer: Medicare HMO | Attending: Family Medicine

## 2022-07-12 ENCOUNTER — Other Ambulatory Visit: Payer: Self-pay | Admitting: *Deleted

## 2022-07-12 DIAGNOSIS — R001 Bradycardia, unspecified: Secondary | ICD-10-CM

## 2022-07-12 NOTE — Progress Notes (Unsigned)
Enrolled for [employer] to mail a ZIO XT long term holter monitor to the patients address on file.   DOD to read. 

## 2022-07-16 DIAGNOSIS — D125 Benign neoplasm of sigmoid colon: Secondary | ICD-10-CM | POA: Diagnosis not present

## 2022-07-16 DIAGNOSIS — D124 Benign neoplasm of descending colon: Secondary | ICD-10-CM | POA: Diagnosis not present

## 2022-08-06 DIAGNOSIS — R7303 Prediabetes: Secondary | ICD-10-CM | POA: Diagnosis not present

## 2022-08-06 DIAGNOSIS — E669 Obesity, unspecified: Secondary | ICD-10-CM | POA: Diagnosis not present

## 2022-08-06 DIAGNOSIS — R001 Bradycardia, unspecified: Secondary | ICD-10-CM | POA: Diagnosis not present

## 2022-08-06 DIAGNOSIS — E785 Hyperlipidemia, unspecified: Secondary | ICD-10-CM | POA: Diagnosis not present

## 2022-08-06 DIAGNOSIS — E559 Vitamin D deficiency, unspecified: Secondary | ICD-10-CM | POA: Diagnosis not present

## 2022-08-06 DIAGNOSIS — Z6830 Body mass index (BMI) 30.0-30.9, adult: Secondary | ICD-10-CM | POA: Diagnosis not present

## 2022-08-30 DIAGNOSIS — R001 Bradycardia, unspecified: Secondary | ICD-10-CM | POA: Diagnosis not present

## 2022-09-17 DIAGNOSIS — E785 Hyperlipidemia, unspecified: Secondary | ICD-10-CM | POA: Diagnosis not present

## 2022-09-17 DIAGNOSIS — E559 Vitamin D deficiency, unspecified: Secondary | ICD-10-CM | POA: Diagnosis not present

## 2022-09-17 DIAGNOSIS — R7303 Prediabetes: Secondary | ICD-10-CM | POA: Diagnosis not present

## 2022-09-17 DIAGNOSIS — Z6830 Body mass index (BMI) 30.0-30.9, adult: Secondary | ICD-10-CM | POA: Diagnosis not present

## 2022-09-17 DIAGNOSIS — E669 Obesity, unspecified: Secondary | ICD-10-CM | POA: Diagnosis not present

## 2022-10-15 DIAGNOSIS — E785 Hyperlipidemia, unspecified: Secondary | ICD-10-CM | POA: Diagnosis not present

## 2022-10-15 DIAGNOSIS — E559 Vitamin D deficiency, unspecified: Secondary | ICD-10-CM | POA: Diagnosis not present

## 2022-10-15 DIAGNOSIS — R7303 Prediabetes: Secondary | ICD-10-CM | POA: Diagnosis not present

## 2022-10-15 DIAGNOSIS — Z6829 Body mass index (BMI) 29.0-29.9, adult: Secondary | ICD-10-CM | POA: Diagnosis not present

## 2022-10-15 DIAGNOSIS — E669 Obesity, unspecified: Secondary | ICD-10-CM | POA: Diagnosis not present

## 2022-10-16 ENCOUNTER — Other Ambulatory Visit: Payer: Self-pay | Admitting: Family Medicine

## 2022-10-16 DIAGNOSIS — Z1231 Encounter for screening mammogram for malignant neoplasm of breast: Secondary | ICD-10-CM

## 2022-10-16 DIAGNOSIS — R7303 Prediabetes: Secondary | ICD-10-CM | POA: Diagnosis not present

## 2022-11-13 ENCOUNTER — Ambulatory Visit
Admission: RE | Admit: 2022-11-13 | Discharge: 2022-11-13 | Disposition: A | Discharge status: Home / Self Care | Payer: Medicare HMO | Source: Ambulatory Visit | Attending: Family Medicine | Admitting: Family Medicine

## 2022-11-13 DIAGNOSIS — Z1231 Encounter for screening mammogram for malignant neoplasm of breast: Secondary | ICD-10-CM | POA: Diagnosis not present

## 2022-11-20 DIAGNOSIS — Z6829 Body mass index (BMI) 29.0-29.9, adult: Secondary | ICD-10-CM | POA: Diagnosis not present

## 2022-11-20 DIAGNOSIS — N1831 Chronic kidney disease, stage 3a: Secondary | ICD-10-CM | POA: Diagnosis not present

## 2022-11-20 DIAGNOSIS — E669 Obesity, unspecified: Secondary | ICD-10-CM | POA: Diagnosis not present

## 2022-11-22 DIAGNOSIS — H2513 Age-related nuclear cataract, bilateral: Secondary | ICD-10-CM | POA: Diagnosis not present

## 2022-11-28 DIAGNOSIS — E1122 Type 2 diabetes mellitus with diabetic chronic kidney disease: Secondary | ICD-10-CM | POA: Diagnosis not present

## 2022-11-28 DIAGNOSIS — E1169 Type 2 diabetes mellitus with other specified complication: Secondary | ICD-10-CM | POA: Diagnosis not present

## 2022-11-28 DIAGNOSIS — N1831 Chronic kidney disease, stage 3a: Secondary | ICD-10-CM | POA: Diagnosis not present

## 2022-12-25 DIAGNOSIS — E559 Vitamin D deficiency, unspecified: Secondary | ICD-10-CM | POA: Diagnosis not present

## 2022-12-25 DIAGNOSIS — E1169 Type 2 diabetes mellitus with other specified complication: Secondary | ICD-10-CM | POA: Diagnosis not present

## 2022-12-25 DIAGNOSIS — N1831 Chronic kidney disease, stage 3a: Secondary | ICD-10-CM | POA: Diagnosis not present

## 2022-12-25 DIAGNOSIS — E785 Hyperlipidemia, unspecified: Secondary | ICD-10-CM | POA: Diagnosis not present

## 2023-01-29 DIAGNOSIS — R490 Dysphonia: Secondary | ICD-10-CM | POA: Diagnosis not present

## 2023-03-05 DIAGNOSIS — E1169 Type 2 diabetes mellitus with other specified complication: Secondary | ICD-10-CM | POA: Diagnosis not present

## 2023-03-05 DIAGNOSIS — E669 Obesity, unspecified: Secondary | ICD-10-CM | POA: Diagnosis not present

## 2023-03-05 DIAGNOSIS — N1831 Chronic kidney disease, stage 3a: Secondary | ICD-10-CM | POA: Diagnosis not present

## 2023-03-05 DIAGNOSIS — E785 Hyperlipidemia, unspecified: Secondary | ICD-10-CM | POA: Diagnosis not present

## 2023-05-21 DIAGNOSIS — H524 Presbyopia: Secondary | ICD-10-CM | POA: Diagnosis not present

## 2023-06-30 DIAGNOSIS — Z0000 Encounter for general adult medical examination without abnormal findings: Secondary | ICD-10-CM | POA: Diagnosis not present

## 2023-06-30 DIAGNOSIS — Z1331 Encounter for screening for depression: Secondary | ICD-10-CM | POA: Diagnosis not present

## 2023-06-30 DIAGNOSIS — I10 Essential (primary) hypertension: Secondary | ICD-10-CM | POA: Diagnosis not present

## 2023-06-30 DIAGNOSIS — E785 Hyperlipidemia, unspecified: Secondary | ICD-10-CM | POA: Diagnosis not present

## 2023-06-30 DIAGNOSIS — J302 Other seasonal allergic rhinitis: Secondary | ICD-10-CM | POA: Diagnosis not present

## 2023-06-30 DIAGNOSIS — H269 Unspecified cataract: Secondary | ICD-10-CM | POA: Diagnosis not present

## 2023-06-30 DIAGNOSIS — E1169 Type 2 diabetes mellitus with other specified complication: Secondary | ICD-10-CM | POA: Diagnosis not present

## 2023-06-30 DIAGNOSIS — M8588 Other specified disorders of bone density and structure, other site: Secondary | ICD-10-CM | POA: Diagnosis not present

## 2023-06-30 DIAGNOSIS — N1831 Chronic kidney disease, stage 3a: Secondary | ICD-10-CM | POA: Diagnosis not present

## 2023-08-27 ENCOUNTER — Ambulatory Visit: Admitting: Podiatry

## 2023-08-27 ENCOUNTER — Ambulatory Visit (INDEPENDENT_AMBULATORY_CARE_PROVIDER_SITE_OTHER)

## 2023-08-27 ENCOUNTER — Encounter: Payer: Self-pay | Admitting: Podiatry

## 2023-08-27 DIAGNOSIS — M2042 Other hammer toe(s) (acquired), left foot: Secondary | ICD-10-CM

## 2023-08-27 NOTE — Progress Notes (Signed)
  Subjective:  Patient ID: Carol Blake, female    DOB: [birth year],   MRN: [id number]  Chief Complaint  Patient presents with   Toe Pain    I injured these two toes (2, 3) on my left foot.  Now, they are hurting.   Numbness    I get numbness and tingling in my left foot.    73 y.o. female presents for concern of left second and third toe injury. Relates years ago she did injure her second and third toes and may have broken them. She relates recently pain has been coming and going and maybe more so in certain shoes that are tight around her toes.  .Also relates numbness and tingling int he left foot.  Denies any other pedal complaints. Denies n/v/f/c.   Past Medical History:  Diagnosis Date   Hypercholesterolemia    Murmur, heart     Objective:  Physical Exam: Vascular: DP/PT pulses 2/4 bilateral. CFT <3 seconds. Normal hair growth on digits. No edema.  Skin. No lacerations or abrasions bilateral feet.  Musculoskeletal: MMT 5/5 bilateral lower extremities in DF, PF, Inversion and Eversion. Deceased ROM in DF of ankle joint. Hammered digits 2-4 on left. No tenderness to palpation about the forefoot. No pain with metatarsal squeeze. Maybe a little tendenress in the second interspace.  Neurological: Sensation intact to light touch.   Assessment:   1. Hammer toe of left foot      Plan:  Patient was evaluated and treated and all questions answered. -X-rays reviewed. No acute fractures or dislocations. Age indeterminante likely fracture in distal proximal phalanx of second digit. Hammered digits 2-5.  -Educated on hammertoes and treatment options  -Discussed padding including metatarsal pads.  -Discussed supportive shoes and inserts.  -Discussed need for potential surgery if pain does not improved.  -Patient to follow-up as needed. Discussed calling if any changes or increased pain.    Asberry Failing, DPM

## 2023-09-03 DIAGNOSIS — Z0100 Encounter for examination of eyes and vision without abnormal findings: Secondary | ICD-10-CM | POA: Diagnosis not present

## 2023-12-29 ENCOUNTER — Other Ambulatory Visit: Payer: Self-pay | Admitting: Family Medicine

## 2023-12-29 DIAGNOSIS — Z1231 Encounter for screening mammogram for malignant neoplasm of breast: Secondary | ICD-10-CM

## 2023-12-30 ENCOUNTER — Ambulatory Visit
Admission: RE | Admit: 2023-12-30 | Discharge: 2023-12-30 | Disposition: A | Discharge status: Home / Self Care | Source: Ambulatory Visit | Attending: Family Medicine | Admitting: Family Medicine

## 2023-12-30 DIAGNOSIS — Z1231 Encounter for screening mammogram for malignant neoplasm of breast: Secondary | ICD-10-CM
